# Patient Record
Sex: Male | Born: 1985 | Race: White | Hispanic: No | Marital: Married | Smoking: Current some day smoker
Health system: Southern US, Community
[De-identification: ages and names within clinical notes are randomized; demographics above are authoritative.]

---
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
## 2005-08-12 ENCOUNTER — Emergency Department: Payer: Self-pay | Admitting: Emergency Medicine

## 2005-08-21 ENCOUNTER — Emergency Department (HOSPITAL_COMMUNITY): Admission: EM | Admit: 2005-08-21 | Discharge: 2005-08-21 | Payer: Self-pay | Admitting: Family Medicine

## 2006-12-10 ENCOUNTER — Inpatient Hospital Stay (HOSPITAL_COMMUNITY): Admission: EM | Admit: 2006-12-10 | Discharge: 2006-12-12 | Payer: Self-pay | Admitting: Emergency Medicine

## 2006-12-23 ENCOUNTER — Encounter: Admission: RE | Admit: 2006-12-23 | Discharge: 2006-12-23 | Payer: Self-pay | Admitting: Neurosurgery

## 2006-12-27 ENCOUNTER — Encounter: Admission: RE | Admit: 2006-12-27 | Discharge: 2006-12-27 | Payer: Self-pay | Admitting: Neurosurgery
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
## 2013-12-05 ENCOUNTER — Emergency Department (HOSPITAL_BASED_OUTPATIENT_CLINIC_OR_DEPARTMENT_OTHER)
Admission: EM | Admit: 2013-12-05 | Discharge: 2013-12-06 | Disposition: A | Discharge status: Home / Self Care | Payer: BC Managed Care – PPO | Attending: Emergency Medicine | Admitting: Emergency Medicine

## 2013-12-05 ENCOUNTER — Encounter (HOSPITAL_BASED_OUTPATIENT_CLINIC_OR_DEPARTMENT_OTHER): Payer: Self-pay | Admitting: Emergency Medicine

## 2013-12-05 DIAGNOSIS — K2990 Gastroduodenitis, unspecified, without bleeding: Principal | ICD-10-CM

## 2013-12-05 DIAGNOSIS — K279 Peptic ulcer, site unspecified, unspecified as acute or chronic, without hemorrhage or perforation: Secondary | ICD-10-CM

## 2013-12-05 DIAGNOSIS — Z9089 Acquired absence of other organs: Secondary | ICD-10-CM | POA: Insufficient documentation

## 2013-12-05 DIAGNOSIS — K2970 Gastritis, unspecified, without bleeding: Secondary | ICD-10-CM

## 2013-12-05 MED ORDER — HYDROCODONE-ACETAMINOPHEN 5-325 MG PO TABS
1.0000 | ORAL_TABLET | Freq: Once | ORAL | Status: AC
  Administered 2013-12-06: 1 via ORAL
  Filled 2013-12-05: qty 1

## 2013-12-05 MED ORDER — GI COCKTAIL ~~LOC~~
30.0000 mL | Freq: Once | ORAL | Status: AC
  Administered 2013-12-06: 30 mL via ORAL
  Filled 2013-12-05: qty 30

## 2013-12-05 MED ORDER — ONDANSETRON 4 MG PO TBDP: 4.0000 mg | ORAL_TABLET | Freq: Three times a day (TID) | ORAL | Status: AC | PRN

## 2013-12-05 MED ORDER — ONDANSETRON 4 MG PO TBDP
4.0000 mg | ORAL_TABLET | Freq: Once | ORAL | Status: AC
  Administered 2013-12-06: 4 mg via ORAL
  Filled 2013-12-05: qty 1

## 2013-12-05 MED ORDER — HYDROCODONE-ACETAMINOPHEN 5-325 MG PO TABS: 2.0000 | ORAL_TABLET | ORAL | Status: AC | PRN

## 2013-12-05 MED ORDER — OMEPRAZOLE 20 MG PO CPDR: 20.0000 mg | DELAYED_RELEASE_CAPSULE | Freq: Two times a day (BID) | ORAL | Status: AC

## 2013-12-05 NOTE — Discharge Instructions (Signed)
Diet for Peptic Ulcer Disease Peptic ulcer disease is a term used to describe open sores in the stomach and digestive tract. These sores can be painful, and the pain can be worse when the stomach is empty. These ulcers can lead to bleeding in the esophagus, stomach, and intestines (gastrointestinaltract). Nutrition therapy can help ease the discomfort of peptic ulcer disease.   GENERAL DIET GUIDELINES FOR PEPTIC ULCER DISEASE  Your diet should be rich in fruits, vegetables, and whole grains. It should also be low in fat.   Avoid foods that can irritate your sores.   Avoid foods that are not tolerated or foods that cause pain. This may be different for different people. FOODS AND DRINKS TO AVOID  High-fat dairy and milk products including whole milk, cream, chocolate milk, butter, sour cream, or cream cheese.   High-fat meats and poultry including hot dogs, fried meats or poultry, meats with skin, sausage, or bacon.   Pepper.  Alcohol.  Chocolate.  Tea, coffee, and cola (regular and caffeine).  Lard or hydrogenated oils such as stick margarine.  SAMPLE MEAL PLAN This meal plan is approximately 2000 calories based on https://www.[place].org/ChooseMyPlate.gov meal planning guidelines.  Breakfast   cup cooked oatmeal.  1 cup strawberries.  1 cup low-fat milk.  1 oz almonds. Snack  1 cup cucumber slices.  6 oz yogurt (made from low-fat or fat-free milk). Lunch  2 slices whole-wheat bread.  2 oz sliced [place]urkey.  2 tsp mayonnaise.  1 cup blueberries.  1 cup snap peas. Snack  6 whole-wheat crackers.  1 oz string cheese. Dinner   cup brown rice.  1 cup mixed veggies.  1 tsp olive oil.  3 oz grilled fish. Document Released: 10/26/2011 Document Reviewed: 10/26/2011 [facility]Exit[facility] Patient Information 2014 [place]OurseExitCare, MarylandLLC.  Gastritis, Adult Gastritis is soreness and puffiness (inflammation) of the lining of the stomach. If you do not get help, gastritis can cause bleeding and  sores (ulcers) in the stomach. HOME CARE   Only take medicine as told by your doctor.  If you were given antibiotic medicines, take them as told. Finish the medicines even if you start to feel better.  Drink enough fluids to keep your pee (urine) clear or pale yellow.  Avoid foods and drinks that make your problems worse. Foods you may want to avoid include:  Caffeine or alcohol.  Chocolate.  Mint.  Garlic and onions.  Spicy foods.  Citrus fruits, including oranges, lemons, or limes.  Food containing tomatoes, including sauce, chili, salsa, and pizza.  Fried and fatty foods.  Eat small meals throughout the day instead of large meals. GET HELP RIGHT AWAY IF:   You have black or dark red poop (stools).  You throw up (vomit) blood. It may look like coffee grounds.  You cannot keep fluids down.  Your belly (abdominal) pain gets worse.  You have a fever.  You do not feel better after 1 week.  You have any other questions or concerns. MAKE SURE YOU:   Understand these instructions.  Will watch your condition.  Will get help right away if you are not doing well or get worse. Document Released: 01/20/2008 Document Revised: 10/26/2011 Document Reviewed: 09/16/2011 [facility]ExitCare Patient Information 2014 [place]ExitCare, MarylandLLC.  Peptic Ulcer A peptic ulcer is a sore in the lining of in your esophagus (esophageal ulcer), stomach (gastric ulcer), or in the first part of your small intestine (duodenal ulcer). The ulcer causes erosion into the deeper tissue. CAUSES  Normally, the lining of  the stomach and the small intestine protects itself from the acid that digests food. The protective lining can be damaged by:  An infection caused by a bacterium called Helicobacter pylori (H. pylori).  Regular use of nonsteroidal anti-inflammatory drugs (NSAIDs), such as ibuprofen or aspirin.  Smoking tobacco. Other risk factors include being older than 50, drinking alcohol excessively, and  having a family history of ulcer disease.  SYMPTOMS   Burning pain or gnawing in the area between the chest and the belly button.  Heartburn.  Nausea and vomiting.  Bloating. The pain can be worse on an empty stomach and at night. If the ulcer results in bleeding, it can cause:  Black, tarry stools.  Vomiting of bright red blood.  Vomiting of coffee ground looking materials. DIAGNOSIS  A diagnosis is usually made based upon your history and an exam. Other tests and procedures may be performed to find the cause of the ulcer. Finding a cause will help determine the best treatment. Tests and procedures may include:  Blood tests, stool tests, or breath tests to check for the bacterium H. pylori.  An upper gastrointestinal (GI) series of the esophagus, stomach, and small intestine.  An endoscopy to examine the esophagus, stomach, and small intestine.  A biopsy. TREATMENT  Treatment may include:  Eliminating the cause of the ulcer, such as smoking, NSAIDs, or alcohol.  Medicines to reduce the amount of acid in your digestive tract.  Antibiotic medicines if the ulcer is caused by the H. pylori bacterium.  An upper endoscopy to treat a bleeding ulcer.  Surgery if the bleeding is severe or if the ulcer created a hole somewhere in the digestive system. HOME CARE INSTRUCTIONS   Avoid tobacco, alcohol, and caffeine. Smoking can increase the acid in the stomach, and continued smoking will impair the healing of ulcers.  Avoid foods and drinks that seem to cause discomfort or aggravate your ulcer.  Only take medicines as directed by your caregiver. Do not substitute over-the-counter medicines for prescription medicines without talking to your caregiver.  Keep any follow-up appointments and tests as directed. SEEK MEDICAL CARE IF:   Your do not improve within 7 days of starting treatment.  You have ongoing indigestion or heartburn. SEEK IMMEDIATE MEDICAL CARE IF:   You have  sudden, sharp, or persistent abdominal pain.  You have bloody or dark black, tarry stools.  You vomit blood or vomit that looks like coffee grounds.  You become light headed, weak, or feel faint.  You become sweaty or clammy. MAKE SURE YOU:   Understand these instructions.  Will watch your condition.  Will get help right away if you are not doing well or get worse. Document Released: 07/31/2000 Document Revised: 04/27/2012 Document Reviewed: 03/02/2012 [facility]Exit[facility] Patient Information 2014 [place]ExitCare, MarylandLLC.

## 2013-12-05 NOTE — ED Notes (Signed)
C/o abd pain "like it's on fire" since 830pm

## 2013-12-05 NOTE — ED Provider Notes (Signed)
CSN: [id number]     Arrival date & time 12/05/13  2255 History  This chart was scribed for Rolland PorterMark Rolinda Impson, MD by Jarvis Morganaylor Ferguson, ED Scribe. This patient was seen in room MH11/MH11 and the patient's care was started at 11:37 PM    Chief Complaint  Patient presents with  . Abdominal Pain    The history is provided by the patient. No language interpreter was used.    HPI Comments: Ray Franklin is a 28 y.o. male who presents to the Emergency Department complaining of moderate burning epigastric abdominal pain, that radiates to his back, onset 3 hours ago. Patient states that he had a similar episodes over the past 12 days. Patient has had some associated nausea and diarrhea. Patient state he took Tums with no relief, he states that Zantac has helped with prior episodes, however Pepto Bismol has not offered relief with prior episodes. Patient also reports that he had 1 episode of "spitting up blood". Patient states that the symptoms are not modified by eating. Patient does not smoke or drink alcohol. Patient states that he has some mild caffeine intake. Patient denies taking any NSAIDs. Patient denies any hematochezia. Patient states that his mother had a cholecystectomy.    History reviewed. No pertinent past medical history. History reviewed. No pertinent past surgical history. No family history on file. History  Substance Use Topics  . Smoking status: Never Smoker   . Smokeless tobacco: Not on file  . Alcohol Use: No    Review of Systems  Constitutional: Negative for fever, chills, diaphoresis, appetite change and fatigue.  HENT: Negative for mouth sores, sore throat and trouble swallowing.   Eyes: Negative for visual disturbance.  Respiratory: Negative for cough, chest tightness, shortness of breath and wheezing.   Cardiovascular: Negative for chest pain.  Gastrointestinal: Positive for nausea, abdominal pain and diarrhea. Negative for vomiting, blood in stool and abdominal distention.   Endocrine: Negative for polydipsia, polyphagia and polyuria.  Genitourinary: Negative for dysuria, frequency and hematuria.  Musculoskeletal: Negative for gait problem.  Skin: Negative for color change, pallor and rash.  Neurological: Negative for dizziness, syncope, light-headedness and headaches.  Hematological: Does not bruise/bleed easily.  Psychiatric/Behavioral: Negative for behavioral problems and confusion.      Allergies  Review of patient's allergies indicates no known allergies.  Home Medications   Prior to Admission medications   Not on File   Triage Vitals: BP 112/71  Pulse 75  Temp(Src) 97.8 F (36.6 C) (Oral)  Resp 16  Ht 6\' 3"  (1.905 m)  Wt 165 lb (74.844 kg)  BMI 20.62 kg/m2  SpO2 100%  Physical Exam  Nursing note and vitals reviewed. Constitutional: He is oriented to person, place, and time. He appears well-developed and well-nourished. No distress.  HENT:  Head: Normocephalic.  Eyes: Conjunctivae are normal. Pupils are equal, round, and reactive to light. No scleral icterus.  Neck: Normal range of motion. Neck supple. No thyromegaly present.  Cardiovascular: Normal rate and regular rhythm.  Exam reveals no gallop and no friction rub.   No murmur heard. Pulmonary/Chest: Effort normal and breath sounds normal. No respiratory distress. He has no wheezes. He has no rales.  Abdominal: Soft. Bowel sounds are normal. He exhibits no distension. There is tenderness. There is no rebound.  Mild tenderness in epigastrium  Musculoskeletal: Normal range of motion.  Neurological: He is alert and oriented to person, place, and time.  Skin: Skin is warm and dry. No rash noted.  Psychiatric: He  has a normal mood and affect. His behavior is normal.    ED Course  Procedures (including critical care time)  DIAGNOSTIC STUDIES: Oxygen Saturation is 100% on RA, normal by my interpretation.    COORDINATION OF CARE: 11:46 PM Discussed with patient plan to discharge  with medications. Pt advised of plan for treatment and pt agrees.      Labs Review Labs Reviewed - No data to display  Imaging Review No results found.   EKG Interpretation None      MDM   Final diagnoses:  Gastritis  Peptic ulcer disease    Young healthy male. No symptoms to suggest biliary colic. No risk for pancreatitis. Exam history all suggest gastritis or peptic ulcer disease. Plan will be him. Treatment with proton pump inhibitor.  Diet treatment discussed with patient. Recheck if worsening pain, hematemesis, dark stools, lightheaded, syncope, weakness, or other changes.   I personally performed the services described in this documentation, which was scribed in my presence. The recorded information has been reviewed and is accurate.     Rolland PorterMark Alvenia Treese, MD 12/05/13 [phone]

## 2017-01-11 ENCOUNTER — Encounter (HOSPITAL_COMMUNITY): Payer: Self-pay | Admitting: *Deleted

## 2017-01-11 ENCOUNTER — Emergency Department (HOSPITAL_COMMUNITY): Payer: BLUE CROSS/BLUE SHIELD

## 2017-01-11 ENCOUNTER — Other Ambulatory Visit: Payer: Self-pay

## 2017-01-11 ENCOUNTER — Emergency Department (HOSPITAL_COMMUNITY)
Admission: EM | Admit: 2017-01-11 | Discharge: 2017-01-11 | Disposition: A | Discharge status: Home / Self Care | Payer: BLUE CROSS/BLUE SHIELD | Attending: Emergency Medicine | Admitting: Emergency Medicine

## 2017-01-11 DIAGNOSIS — Z79899 Other long term (current) drug therapy: Secondary | ICD-10-CM | POA: Diagnosis not present

## 2017-01-11 DIAGNOSIS — F17200 Nicotine dependence, unspecified, uncomplicated: Secondary | ICD-10-CM | POA: Insufficient documentation

## 2017-01-11 DIAGNOSIS — R079 Chest pain, unspecified: Secondary | ICD-10-CM | POA: Diagnosis present

## 2017-01-11 LAB — CBC
HCT: 42.4 % (ref 39.0–52.0)
HEMOGLOBIN: 14.5 g/dL (ref 13.0–17.0)
MCH: 30 pg (ref 26.0–34.0)
MCHC: 34.2 g/dL (ref 30.0–36.0)
MCV: 87.6 fL (ref 78.0–100.0)
Platelets: 211 10*3/uL (ref 150–400)
RBC: 4.84 MIL/uL (ref 4.22–5.81)
RDW: 12.7 % (ref 11.5–15.5)
WBC: 5.5 10*3/uL (ref 4.0–10.5)

## 2017-01-11 LAB — I-STAT TROPONIN, ED
TROPONIN I, POC: 0 ng/mL (ref 0.00–0.08)
TROPONIN I, POC: 0 ng/mL (ref 0.00–0.08)

## 2017-01-11 LAB — BASIC METABOLIC PANEL
ANION GAP: 8 (ref 5–15)
BUN: 15 mg/dL (ref 6–20)
CALCIUM: 9.2 mg/dL (ref 8.9–10.3)
CO2: 26 mmol/L (ref 22–32)
Chloride: 104 mmol/L (ref 101–111)
Creatinine, Ser: 0.94 mg/dL (ref 0.61–1.24)
Glucose, Bld: 106 mg/dL — ABNORMAL HIGH (ref 65–99)
Potassium: 4.2 mmol/L (ref 3.5–5.1)
Sodium: 138 mmol/L (ref 135–145)

## 2017-01-11 LAB — D-DIMER, QUANTITATIVE (NOT AT ARMC)

## 2017-01-11 MED ORDER — ASPIRIN 81 MG PO CHEW
324.0000 mg | CHEWABLE_TABLET | Freq: Once | ORAL | Status: AC
  Administered 2017-01-11: 324 mg via ORAL
  Filled 2017-01-11: qty 4

## 2017-01-11 NOTE — ED Triage Notes (Signed)
Pt reports L sided CP that radiates to mid back with dizziness and SOB today while at work, denies cardiac history, reports some SOB, reports pain worse with movement, denies n/v/d, no medical history, A&O x4

## 2017-01-11 NOTE — ED Provider Notes (Signed)
[facility]-EMERGENCY DEPT Provider Note   CSN: [id number] Arrival date & time: 01/11/17  1120     History   Chief Complaint Chief Complaint  Patient presents with  . Chest Pain    HPI Ray Franklin is a 31 y.o. male.  The history is provided by the patient.  Chest Pain   This is a new problem. Episode onset: started around 945am. The problem occurs constantly. The problem has been gradually improving. The pain is associated with lifting. Pain location: left chest. The pain is mild. Quality: aching. Radiates to: back and shoulder. Episode Length: started around 945am. Pertinent negatives include no abdominal pain, no cough, no diaphoresis, no fever, no nausea, no shortness of breath and no vomiting. He has tried nothing for the symptoms. The treatment provided no relief. There are no known risk factors.    History reviewed. No pertinent past medical history.  There are no active problems to display for this patient.   History reviewed. No pertinent surgical history.     Home Medications    Prior to Admission medications   Medication Sig Start Date End Date Taking? Authorizing Provider  HYDROcodone-acetaminophen (NORCO/VICODIN) 5-325 MG per tablet Take 2 tablets by mouth every 4 (four) hours as needed. 12/05/13   Rolland PorterJames, Mark, MD  omeprazole (PRILOSEC) 20 MG capsule Take 1 capsule (20 mg total) by mouth 2 (two) times daily. 12/05/13   Rolland PorterJames, Mark, MD  ondansetron (ZOFRAN ODT) 4 MG disintegrating tablet Take 1 tablet (4 mg total) by mouth every 8 (eight) hours as needed for nausea. 12/05/13   Rolland PorterJames, Mark, MD    Family History No family history on file.  Social History Social History  Substance Use Topics  . Smoking status: Current Some Day Smoker  . Smokeless tobacco: Current User    Types: Snuff  . Alcohol use No     Allergies   Patient has no known allergies.   Review of Systems Review of Systems  Constitutional: Negative for diaphoresis and fever.  HENT: Negative.    Respiratory: Negative for cough and shortness of breath.   Cardiovascular: Positive for chest pain.  Gastrointestinal: Negative for abdominal pain, diarrhea, nausea and vomiting.  Genitourinary: Negative.   Skin: Negative.   Neurological: Negative.   All other systems reviewed and are negative.    Physical Exam Updated Vital Signs BP 107/66 (BP Location: Right Arm)   Pulse (!) 54   Temp 98 F (36.7 C) (Oral)   Resp 16   Ht 6\' 2"  (1.88 m)   Wt 68.9 kg (152 lb)   SpO2 99%   BMI 19.52 kg/m   Physical Exam  Constitutional: He is oriented to person, place, and time. He appears well-developed and well-nourished.  Tall thin male  HENT:  Head: Normocephalic and atraumatic.  Mouth/Throat: Oropharynx is clear and moist.  Eyes: Conjunctivae and EOM are normal.  Neck: Normal range of motion. Neck supple.  Cardiovascular: Normal rate, regular rhythm, normal heart sounds and intact distal pulses.   No murmur heard. Pulmonary/Chest: Effort normal and breath sounds normal. No respiratory distress. He exhibits no tenderness.  Abdominal: Soft. There is no tenderness. There is no rebound and no guarding.  Musculoskeletal: Normal range of motion. He exhibits no edema or tenderness.  Neurological: He is alert and oriented to person, place, and time. No cranial nerve deficit. He exhibits normal muscle tone. Coordination normal.  Skin: Skin is warm and dry.  Psychiatric: He has a normal mood and affect.  Nursing note and vitals reviewed.    ED Treatments / Results  Labs (all labs ordered are listed, but only abnormal results are displayed) Labs Reviewed  BASIC METABOLIC PANEL - Abnormal; Notable for the following:       Result Value   Glucose, Bld 106 (*)    All other components within normal limits  CBC  D-DIMER, QUANTITATIVE (NOT AT [facility])  I-STAT TROPOININ, ED  Rosezena Sensor, ED    EKG  EKG Interpretation None       Radiology Dg Chest 2 View  Result Date:  01/11/2017 CLINICAL DATA:  Left-sided chest pain and numbness EXAM: CHEST  2 VIEW COMPARISON:  None. FINDINGS: The heart size and mediastinal contours are within normal limits. Both lungs are clear. The visualized skeletal structures are unremarkable. IMPRESSION: No active cardiopulmonary disease. Electronically Signed   By: Elige Ko   On: 01/11/2017 12:02    Procedures Procedures (including critical care time)  Medications Ordered in ED Medications  aspirin chewable tablet 324 mg (324 mg Oral Given 01/11/17 1401)     Initial Impression / Assessment and Plan / ED Course  I have reviewed the triage vital signs and the nursing notes.  Pertinent labs & imaging results that were available during my care of the patient were reviewed by me and considered in my medical decision making (see chart for details).     Patient is a 32 year old male who presents with left-sided chest achiness that began around 945 today while at work. No significant cardiac history or additional medical history. No vascular history that he is aware of. Patient only has a family history of cardiac disease and blood clots but nothing in his personal history. Vital signs unremarkable and exam without acute findings with intact pulses and heart sounds. This time patient is a low heart score an EKG reveals some J-point elevation consistent with benign repol abnormality and unchanged over serial EKGs here. Delta troponin negative. Patient's exam is consistent with a tall and thin male but he is otherwise low risk for aortic pathology. Chest x-ray without widened mediastinum. D-dimer obtained which was negative. So at this time I doubt patient has a PE, aortic dissection, ACS, pneumonia, pneumothorax. He is pain-free after aspirin.  I have reviewed all labs and imaging. Patient stable for discharge home.  I have reviewed all results with the patient. Advised to f/u with a pcp within 3-5 days. Patient agrees to stated plan. All  questions answered. Advised to call or return to have any questions, new symptoms, change in symptoms, or symptoms that they do not understand.   Final Clinical Impressions(s) / ED Diagnoses   Final diagnoses:  Chest pain, unspecified type    New Prescriptions New Prescriptions   No medications on file     Marijean Niemann, MD 01/11/17 1540    Long, Arlyss Repress, MD 01/11/17 [phone]

## 2017-01-11 NOTE — Discharge Instructions (Signed)
call or return to have any questions, new symptoms, change in symptoms, or symptoms that you  do not understand. ° °

## 2017-07-17 IMAGING — DX DG CHEST 2V
2 series · 2 of 2 positions shown · non-contrast
Comparison: None.

CLINICAL DATA: Left-sided chest pain and numbness

EXAM:
CHEST  2 VIEW

[chest pa]
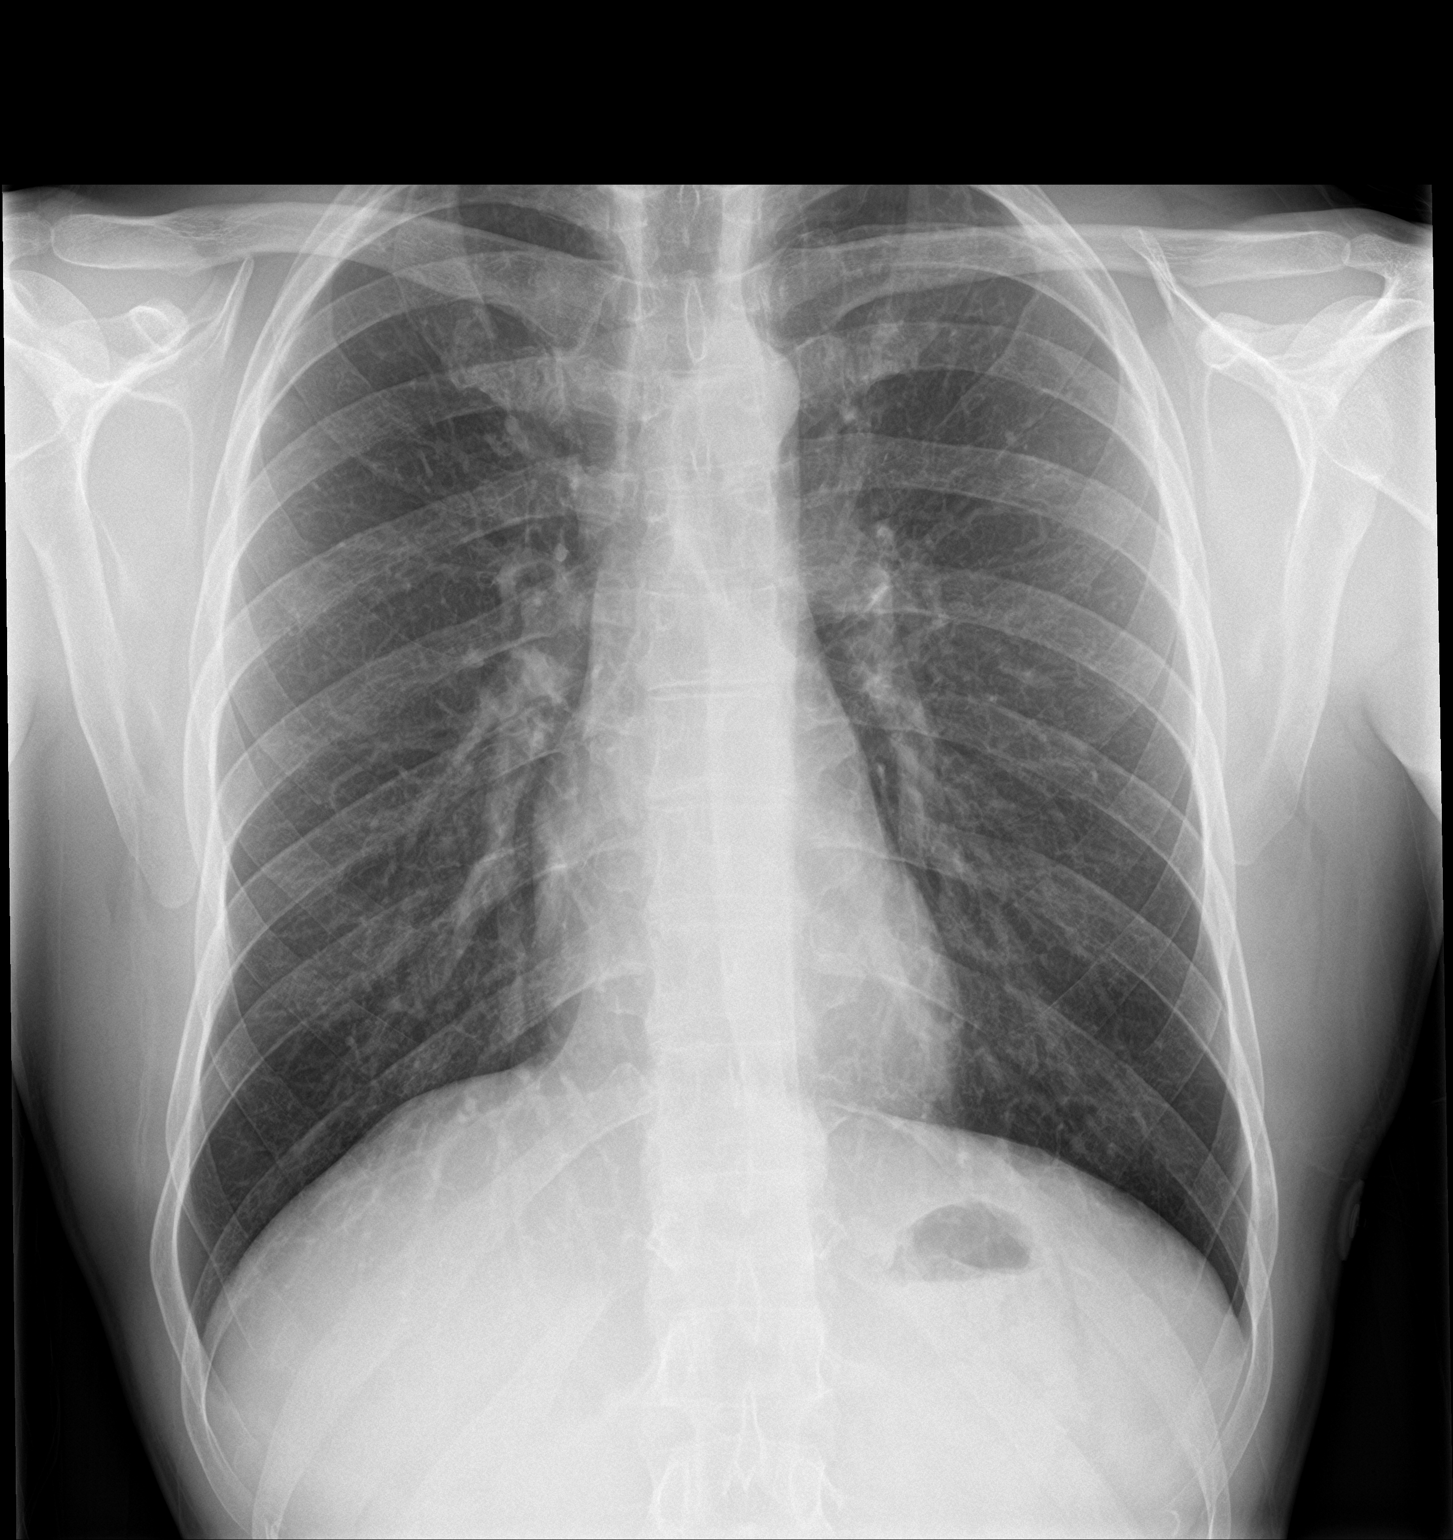

[chest lat]
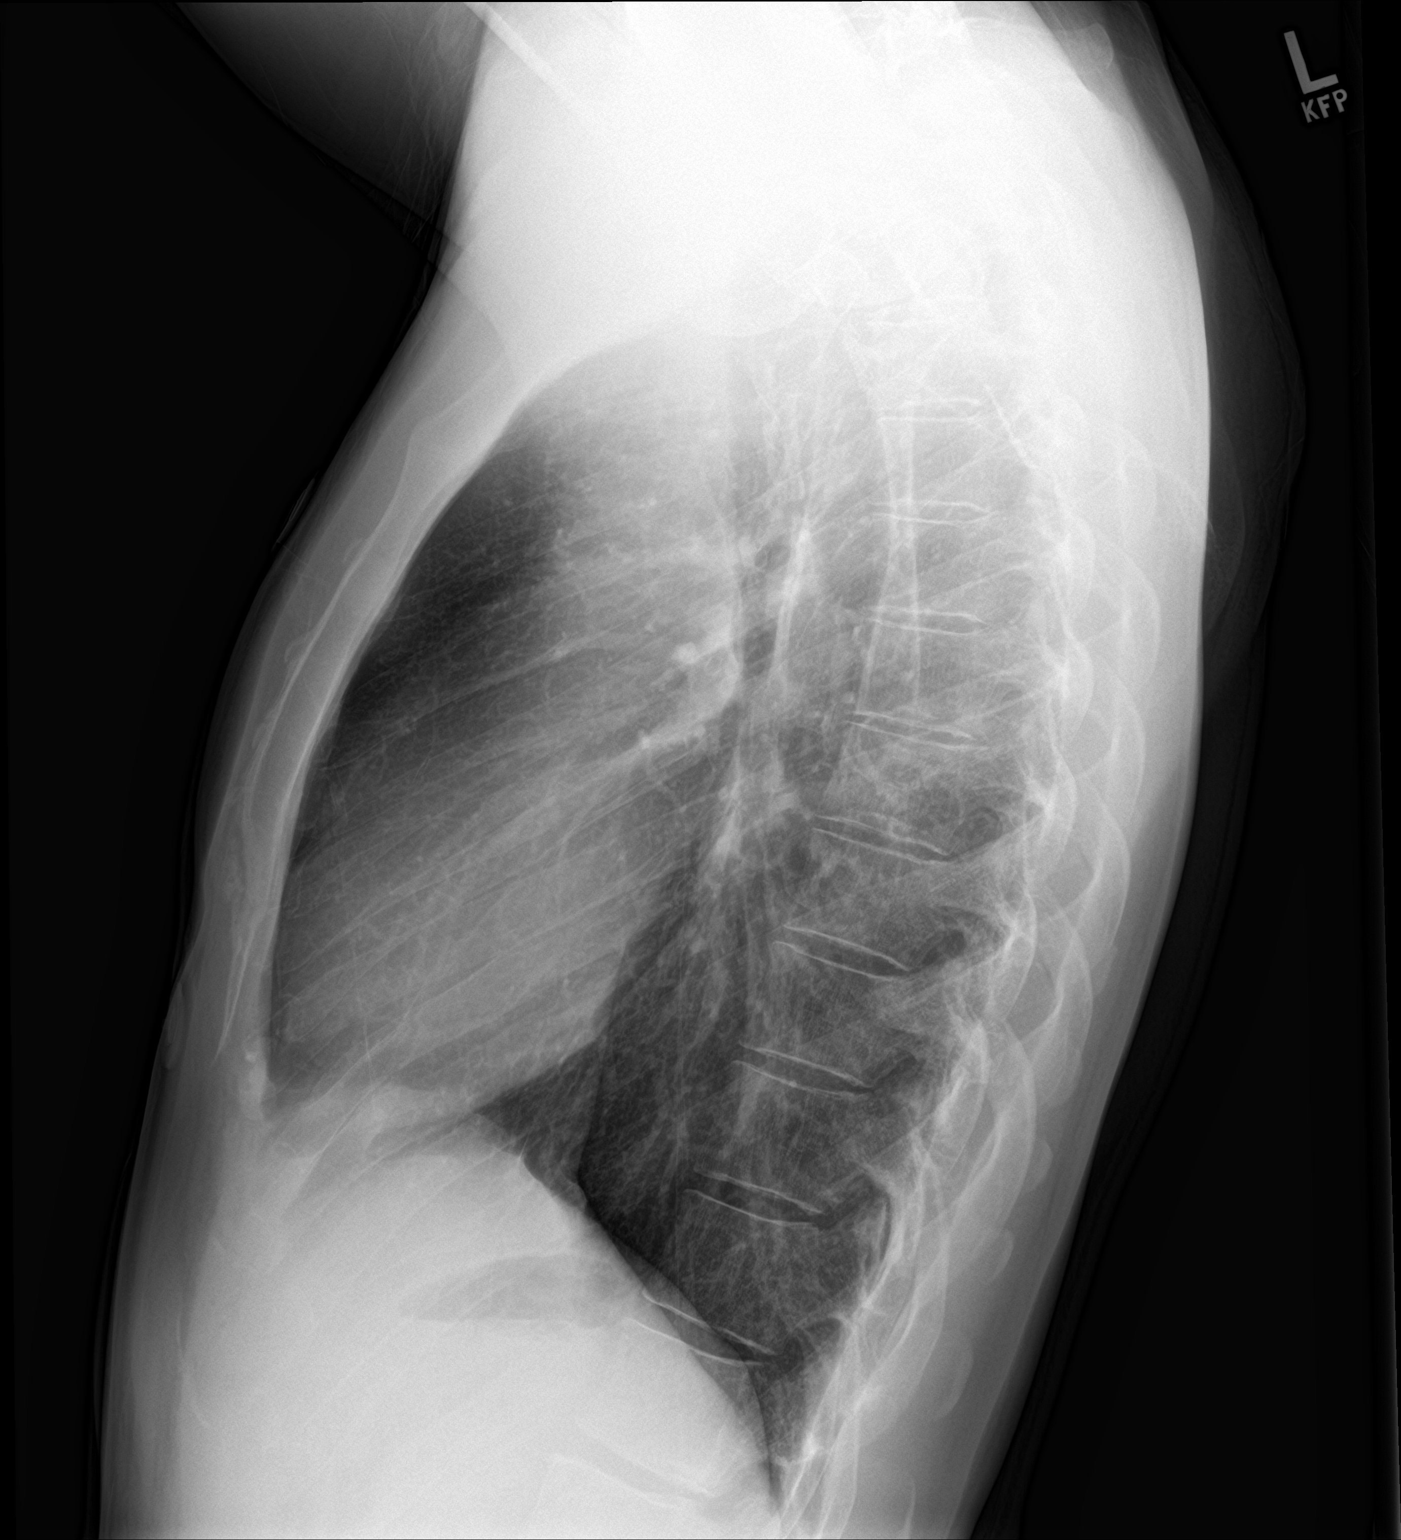

[2 of 2 positions shown; findings below may reference images not displayed]

FINDINGS: The heart size and mediastinal contours are within normal limits.
Both lungs are clear. The visualized skeletal structures are
unremarkable.
IMPRESSION: No active cardiopulmonary disease.

## 2017-11-10 ENCOUNTER — Ambulatory Visit: Payer: Self-pay | Admitting: Emergency Medicine

## 2017-11-10 ENCOUNTER — Encounter: Payer: Self-pay | Admitting: Emergency Medicine

## 2017-11-10 VITALS — BP 100/60 | HR 91 | Temp 98.9°F | Wt 156.0 lb

## 2017-11-10 DIAGNOSIS — R6889 Other general symptoms and signs: Secondary | ICD-10-CM

## 2017-11-10 DIAGNOSIS — K529 Noninfective gastroenteritis and colitis, unspecified: Secondary | ICD-10-CM

## 2017-11-10 NOTE — Patient Instructions (Signed)

## 2017-11-10 NOTE — Progress Notes (Signed)
S: Ray Franklin is a 32 y.o. male who presents for evaluation of nausea, vomiting, weakness, and myalgias. Symptoms began 24 hours ago, daughter has "stomach flu" with diarrhea and vomiting. He has had no fever, cough, congestion, or rhinorrhea. Not taken any OTC medicines, otherwise reports to be in good health.  Review of Systems  Constitutional: Positive for chills, malaise/fatigue and weight loss. Negative for fever.  HENT: Negative.   Respiratory: Negative.   Cardiovascular: Negative.   Gastrointestinal: Positive for nausea and vomiting. Negative for abdominal pain, constipation and diarrhea.  Genitourinary: Negative.   Musculoskeletal: Positive for myalgias.  Neurological: Negative.     O: Vitals:   11/10/17 1236  BP: 100/60  Pulse: 91  Temp: 98.9 F (37.2 C)  SpO2: 96%   Physical Exam  Constitutional: He appears well-developed and well-nourished. No distress.  HENT:  Head: Normocephalic.  Right Ear: External ear normal.  Left Ear: External ear normal.  Mouth/Throat: Oropharynx is clear and moist.  Eyes: Conjunctivae are normal.  Neck: Normal range of motion.  Cardiovascular: Normal rate and regular rhythm.  Pulmonary/Chest: Effort normal and breath sounds normal.  Abdominal: Soft. Bowel sounds are normal. There is no tenderness.  Lymphadenopathy:    He has no cervical adenopathy.  Neurological: He is alert.  Skin: Skin is warm and dry. Capillary refill takes less than 2 seconds. He is not diaphoretic.  Nursing note and vitals reviewed.    A: 1. Flu-like symptoms   2. Gastroenteritis     P: A febrile without antipyretics, doubt influenza, no abdominal pain or evidence of surgical abdomen. Rest, fluids, clear liquid diet followed by gradual reintroduction of solid foods such as bananas, rice toast, etc, pt declines antiemetics. ER if symptoms worsen.

## 2017-11-16 ENCOUNTER — Telehealth: Payer: Self-pay

## 2017-11-16 NOTE — Telephone Encounter (Signed)
Called to f/u with pt to see how he was feeling and pt states he is feeling much better.

## 2023-10-01 ENCOUNTER — Other Ambulatory Visit (HOSPITAL_BASED_OUTPATIENT_CLINIC_OR_DEPARTMENT_OTHER): Payer: Self-pay

## 2023-10-01 MED ORDER — LISDEXAMFETAMINE DIMESYLATE 50 MG PO CAPS
50.0000 mg | ORAL_CAPSULE | Freq: Every morning | ORAL | 0 refills | Status: AC
  Filled 2023-10-01: qty 30, 30d supply, fill #0

## 2023-10-21 ENCOUNTER — Other Ambulatory Visit (HOSPITAL_BASED_OUTPATIENT_CLINIC_OR_DEPARTMENT_OTHER): Payer: Self-pay

## 2023-10-25 ENCOUNTER — Other Ambulatory Visit (HOSPITAL_BASED_OUTPATIENT_CLINIC_OR_DEPARTMENT_OTHER): Payer: Self-pay

## 2023-10-25 MED ORDER — LISDEXAMFETAMINE DIMESYLATE 60 MG PO CAPS
60.0000 mg | ORAL_CAPSULE | Freq: Every morning | ORAL | 0 refills | Status: DC
  Filled 2023-10-25: qty 30, 30d supply, fill #0

## 2023-12-21 ENCOUNTER — Other Ambulatory Visit (HOSPITAL_BASED_OUTPATIENT_CLINIC_OR_DEPARTMENT_OTHER): Payer: Self-pay

## 2023-12-21 MED ORDER — LISDEXAMFETAMINE DIMESYLATE 60 MG PO CAPS
60.0000 mg | ORAL_CAPSULE | Freq: Every morning | ORAL | 0 refills | Status: AC
  Filled 2023-12-21: qty 30, 30d supply, fill #0

## 2023-12-21 MED ORDER — LISDEXAMFETAMINE DIMESYLATE 60 MG PO CAPS
60.0000 mg | ORAL_CAPSULE | Freq: Every morning | ORAL | 0 refills | Status: AC
Start: 2024-02-15 — End: ?
  Filled 2024-02-15: qty 30, 30d supply, fill #0

## 2023-12-21 MED ORDER — LISDEXAMFETAMINE DIMESYLATE 60 MG PO CAPS
60.0000 mg | ORAL_CAPSULE | Freq: Every morning | ORAL | 0 refills | Status: AC
  Filled 2024-01-18: qty 30, 30d supply, fill #0

## 2024-01-14 ENCOUNTER — Other Ambulatory Visit (HOSPITAL_BASED_OUTPATIENT_CLINIC_OR_DEPARTMENT_OTHER): Payer: Self-pay

## 2024-01-18 ENCOUNTER — Other Ambulatory Visit (HOSPITAL_BASED_OUTPATIENT_CLINIC_OR_DEPARTMENT_OTHER): Payer: Self-pay

## 2024-01-18 ENCOUNTER — Other Ambulatory Visit: Payer: Self-pay

## 2024-02-15 ENCOUNTER — Other Ambulatory Visit (HOSPITAL_BASED_OUTPATIENT_CLINIC_OR_DEPARTMENT_OTHER): Payer: Self-pay

## 2024-03-14 ENCOUNTER — Other Ambulatory Visit (HOSPITAL_BASED_OUTPATIENT_CLINIC_OR_DEPARTMENT_OTHER): Payer: Self-pay

## 2024-03-14 MED ORDER — LISDEXAMFETAMINE DIMESYLATE 60 MG PO CAPS
60.0000 mg | ORAL_CAPSULE | Freq: Every morning | ORAL | 0 refills | Status: AC
  Filled 2024-03-14 – 2024-03-16 (×2): qty 30, 30d supply, fill #0

## 2024-03-14 MED ORDER — LISDEXAMFETAMINE DIMESYLATE 60 MG PO CAPS
60.0000 mg | ORAL_CAPSULE | Freq: Every day | ORAL | 0 refills | Status: AC
  Filled 2024-04-18: qty 30, 30d supply, fill #0

## 2024-03-14 MED ORDER — LISDEXAMFETAMINE DIMESYLATE 60 MG PO CAPS
60.0000 mg | ORAL_CAPSULE | Freq: Every morning | ORAL | 0 refills | Status: AC
  Filled 2024-05-20: qty 30, 30d supply, fill #0

## 2024-03-16 ENCOUNTER — Other Ambulatory Visit (HOSPITAL_BASED_OUTPATIENT_CLINIC_OR_DEPARTMENT_OTHER): Payer: Self-pay

## 2024-04-18 ENCOUNTER — Other Ambulatory Visit (HOSPITAL_COMMUNITY): Payer: Self-pay

## 2024-04-18 ENCOUNTER — Other Ambulatory Visit (HOSPITAL_BASED_OUTPATIENT_CLINIC_OR_DEPARTMENT_OTHER): Payer: Self-pay

## 2024-04-19 ENCOUNTER — Other Ambulatory Visit (HOSPITAL_COMMUNITY): Payer: Self-pay

## 2024-04-19 ENCOUNTER — Other Ambulatory Visit (HOSPITAL_BASED_OUTPATIENT_CLINIC_OR_DEPARTMENT_OTHER): Payer: Self-pay

## 2024-04-20 ENCOUNTER — Other Ambulatory Visit (HOSPITAL_BASED_OUTPATIENT_CLINIC_OR_DEPARTMENT_OTHER): Payer: Self-pay

## 2024-05-20 ENCOUNTER — Other Ambulatory Visit (HOSPITAL_BASED_OUTPATIENT_CLINIC_OR_DEPARTMENT_OTHER): Payer: Self-pay

## 2024-05-30 ENCOUNTER — Other Ambulatory Visit (HOSPITAL_BASED_OUTPATIENT_CLINIC_OR_DEPARTMENT_OTHER): Payer: Self-pay

## 2024-05-30 MED ORDER — LISDEXAMFETAMINE DIMESYLATE 10 MG PO CAPS
10.0000 mg | ORAL_CAPSULE | Freq: Every day | ORAL | 0 refills | Status: AC
  Filled 2024-05-30: qty 17, 17d supply, fill #0

## 2024-05-30 MED ORDER — LISDEXAMFETAMINE DIMESYLATE 70 MG PO CAPS
70.0000 mg | ORAL_CAPSULE | Freq: Every day | ORAL | 0 refills | Status: AC
  Filled 2024-06-19 – 2024-06-30 (×4): qty 30, 30d supply, fill #0

## 2024-06-06 ENCOUNTER — Other Ambulatory Visit (HOSPITAL_BASED_OUTPATIENT_CLINIC_OR_DEPARTMENT_OTHER): Payer: Self-pay

## 2024-06-19 ENCOUNTER — Other Ambulatory Visit (HOSPITAL_BASED_OUTPATIENT_CLINIC_OR_DEPARTMENT_OTHER): Payer: Self-pay

## 2024-06-29 ENCOUNTER — Other Ambulatory Visit (HOSPITAL_BASED_OUTPATIENT_CLINIC_OR_DEPARTMENT_OTHER): Payer: Self-pay

## 2024-06-30 ENCOUNTER — Other Ambulatory Visit (HOSPITAL_BASED_OUTPATIENT_CLINIC_OR_DEPARTMENT_OTHER): Payer: Self-pay

## 2024-07-29 ENCOUNTER — Other Ambulatory Visit (HOSPITAL_BASED_OUTPATIENT_CLINIC_OR_DEPARTMENT_OTHER): Payer: Self-pay

## 2024-07-31 ENCOUNTER — Other Ambulatory Visit (HOSPITAL_BASED_OUTPATIENT_CLINIC_OR_DEPARTMENT_OTHER): Payer: Self-pay

## 2024-07-31 ENCOUNTER — Other Ambulatory Visit: Payer: Self-pay

## 2024-07-31 MED ORDER — LISDEXAMFETAMINE DIMESYLATE 70 MG PO CAPS
70.0000 mg | ORAL_CAPSULE | Freq: Every morning | ORAL | 0 refills | Status: DC
  Filled 2024-07-31 (×2): qty 30, 30d supply, fill #0

## 2024-08-24 ENCOUNTER — Other Ambulatory Visit (HOSPITAL_BASED_OUTPATIENT_CLINIC_OR_DEPARTMENT_OTHER): Payer: Self-pay

## 2024-08-30 ENCOUNTER — Other Ambulatory Visit (HOSPITAL_BASED_OUTPATIENT_CLINIC_OR_DEPARTMENT_OTHER): Payer: Self-pay

## 2024-08-30 MED ORDER — LISDEXAMFETAMINE DIMESYLATE 70 MG PO CAPS
70.0000 mg | ORAL_CAPSULE | Freq: Every morning | ORAL | 0 refills | Status: AC
  Filled 2024-08-30: qty 30, 30d supply, fill #0
